# Patient Record
Sex: Female | Born: 1980 | Hispanic: Yes | Marital: Married | Smoking: Never smoker
Health system: Southern US, Community
[De-identification: ages and names within clinical notes are randomized; demographics above are authoritative.]

---

## 2005-05-07 ENCOUNTER — Inpatient Hospital Stay: Payer: Self-pay

## 2017-10-22 ENCOUNTER — Ambulatory Visit: Payer: Self-pay | Attending: Oncology | Admitting: *Deleted

## 2017-10-22 ENCOUNTER — Ambulatory Visit
Admission: RE | Admit: 2017-10-22 | Discharge: 2017-10-22 | Disposition: A | Discharge status: Home / Self Care | Payer: Self-pay | Source: Ambulatory Visit | Attending: Oncology | Admitting: Oncology

## 2017-10-22 ENCOUNTER — Encounter (INDEPENDENT_AMBULATORY_CARE_PROVIDER_SITE_OTHER): Payer: Self-pay

## 2017-10-22 VITALS — BP 110/74 | HR 68 | Temp 97.8°F | Ht 60.0 in | Wt 152.0 lb

## 2017-10-22 DIAGNOSIS — N63 Unspecified lump in unspecified breast: Secondary | ICD-10-CM

## 2017-10-22 NOTE — Progress Notes (Signed)
  Subjective:     Patient ID: Sierra Madden, female   DOB: 07/22/1980, 37 y.o.   MRN: [id number]  HPI   Review of Systems     Objective:   Physical Exam  Pulmonary/Chest:         Assessment:     37 year old Hispanic female [facility] evaluation of a left mass.  Elna Breslowarol Hernandez, the interpreter present during the interview and exam.  Patient states she found the nodule about 5 weeks ago on self exam.  Questioned her in regards to the reported nipple discharge by her provider.  States she has never had any nipple discharge.  States the provider squeezed her nipple in the office, and there was yellow discharge.  Denies any spontaneous discharge or any on expression prior to that visit.  On clinical breast exam I can palpate an approximate 2X1 cm mobile nodule at 3:00 left breast at the edge of the areola.  Taught self breast awareness.  Patient has been screened for eligibility.  She does not have any insurance, Medicare or Medicaid.  She also meets financial eligibility.  Hand-out given on the Affordable Care Act.    Plan:     Will get bilateral diagnostic mammogram and ultrasound.  Will follow-up per [facility] protocol.

## 2017-10-23 ENCOUNTER — Encounter: Payer: Self-pay | Admitting: *Deleted

## 2017-10-23 NOTE — Progress Notes (Signed)
Letter mailed from the [facility] to inform patient of her normal mammogram results.  Patient is to follow-up with annual screening at the age of 37.  [facility] to [place].

## 2017-10-23 NOTE — Patient Instructions (Signed)
Gave patient hand-out, Women Staying Healthy, Active and Well from [facility], with education on breast health, pap smears, heart and colon health. 

## 2017-10-30 ENCOUNTER — Encounter: Payer: Self-pay | Admitting: Emergency Medicine

## 2017-10-30 ENCOUNTER — Other Ambulatory Visit: Payer: Self-pay

## 2017-10-30 ENCOUNTER — Emergency Department: Payer: No Typology Code available for payment source

## 2017-10-30 ENCOUNTER — Emergency Department
Admission: EM | Admit: 2017-10-30 | Discharge: 2017-10-30 | Disposition: A | Discharge status: Home / Self Care | Payer: No Typology Code available for payment source | Attending: Emergency Medicine | Admitting: Emergency Medicine

## 2017-10-30 DIAGNOSIS — Y998 Other external cause status: Secondary | ICD-10-CM | POA: Insufficient documentation

## 2017-10-30 DIAGNOSIS — T07XXXA Unspecified multiple injuries, initial encounter: Secondary | ICD-10-CM

## 2017-10-30 DIAGNOSIS — Y92410 Unspecified street and highway as the place of occurrence of the external cause: Secondary | ICD-10-CM | POA: Insufficient documentation

## 2017-10-30 DIAGNOSIS — Y9389 Activity, other specified: Secondary | ICD-10-CM | POA: Diagnosis not present

## 2017-10-30 DIAGNOSIS — S161XXA Strain of muscle, fascia and tendon at neck level, initial encounter: Secondary | ICD-10-CM | POA: Diagnosis not present

## 2017-10-30 DIAGNOSIS — S1091XA Abrasion of unspecified part of neck, initial encounter: Secondary | ICD-10-CM | POA: Insufficient documentation

## 2017-10-30 DIAGNOSIS — S40812A Abrasion of left upper arm, initial encounter: Secondary | ICD-10-CM | POA: Insufficient documentation

## 2017-10-30 DIAGNOSIS — S199XXA Unspecified injury of neck, initial encounter: Secondary | ICD-10-CM | POA: Diagnosis present

## 2017-10-30 MED ORDER — NAPROXEN 500 MG PO TABS
500.0000 mg | ORAL_TABLET | Freq: Once | ORAL | Status: AC
  Administered 2017-10-30: 500 mg via ORAL
  Filled 2017-10-30: qty 1

## 2017-10-30 MED ORDER — NAPROXEN 500 MG PO TABS
500.0000 mg | ORAL_TABLET | Freq: Two times a day (BID) | ORAL | 0 refills | Status: AC
Start: 2017-10-30 — End: 2017-11-14

## 2017-10-30 MED ORDER — CYCLOBENZAPRINE HCL 5 MG PO TABS: 5.0000 mg | ORAL_TABLET | Freq: Three times a day (TID) | ORAL | 0 refills | Status: DC | PRN

## 2017-10-30 MED ORDER — CYCLOBENZAPRINE HCL 10 MG PO TABS
10.0000 mg | ORAL_TABLET | Freq: Once | ORAL | Status: AC
  Administered 2017-10-30: 10 mg via ORAL
  Filled 2017-10-30: qty 1
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
## 2017-10-30 NOTE — ED Notes (Signed)
Pt denies any neck pain at this time.  
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
## 2017-11-02 ENCOUNTER — Other Ambulatory Visit: Payer: Self-pay
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
## 2023-09-08 ENCOUNTER — Ambulatory Visit
Admission: EM | Admit: 2023-09-08 | Discharge: 2023-09-08 | Disposition: A | Discharge status: Home / Self Care | Payer: Self-pay | Attending: Emergency Medicine | Admitting: Emergency Medicine

## 2023-09-08 DIAGNOSIS — J302 Other seasonal allergic rhinitis: Secondary | ICD-10-CM

## 2023-09-08 LAB — POC SARS CORONAVIRUS 2 AG -  ED: SARS Coronavirus 2 Ag: NEGATIVE

## 2023-09-08 MED ORDER — LEVOCETIRIZINE DIHYDROCHLORIDE 5 MG PO TABS: 5.0000 mg | ORAL_TABLET | Freq: Every evening | ORAL | 0 refills | Status: DC

## 2023-09-08 MED ORDER — FLUTICASONE PROPIONATE 50 MCG/ACT NA SUSP: 1.0000 | Freq: Every day | NASAL | 0 refills | Status: DC

## 2023-09-08 NOTE — ED Triage Notes (Addendum)
 Patient to Urgent Care with complaints of productive cough/ headaches. Symptoms x3 days. Worse at night. Denies any known fevers.  Hx of seasonal allergies. Taking otc allergy medication.

## 2023-09-08 NOTE — ED Provider Notes (Signed)
 Arlander Bellman    CSN: [id number] Arrival date & time: 09/08/23  1417      History   Chief Complaint Chief Complaint  Patient presents with   Cough    HPI Dayjah Selman is a 43 y.o. female.  Accompanied by her husband, patient presents with postnasal drip, cough, headache x 3 days.  No fever or shortness of breath.  Treatment attempted with Zyrtec.  Patient reports history of seasonal allergies.  The history is provided by the patient, the spouse and medical records. A language interpreter was used.    History reviewed. No pertinent past medical history.  There are no active problems to display for this patient.   History reviewed. No pertinent surgical history.  OB History   No obstetric history on file.      Home Medications    Prior to Admission medications   Medication Sig Start Date End Date Taking? Authorizing Provider  fluticasone  (FLONASE ) 50 MCG/ACT nasal spray Place 1 spray into both nostrils daily. 09/08/23  Yes Wellington Half, NP  levocetirizine (XYZAL ) 5 MG tablet Take 1 tablet (5 mg total) by mouth every evening for 14 days. 09/08/23 09/22/23 Yes Wellington Half, NP  cyclobenzaprine  (FLEXERIL ) 5 MG tablet Take 1 tablet (5 mg total) by mouth 3 (three) times daily as needed for muscle spasms. Patient not taking: Reported on 09/08/2023 10/30/17   Menshew, Raye Cai, PA-C    Family History Family History  Problem Relation Age of Onset   Breast cancer Neg Hx     Social History Social History   Tobacco Use   Smoking status: Never   Smokeless tobacco: Never  Vaping Use   Vaping status: Never Used  Substance Use Topics   Alcohol use: Never   Drug use: Never     Allergies   Patient has no known allergies.   Review of Systems Review of Systems  Constitutional:  Negative for chills and fever.  HENT:  Positive for postnasal drip. Negative for ear pain and sore throat.   Respiratory:  Positive for cough. Negative for shortness of  breath.      Physical Exam Triage Vital Signs ED Triage Vitals  Encounter Vitals Group     BP 09/08/23 1500 136/81     Systolic BP Percentile --      Diastolic BP Percentile --      Pulse Rate 09/08/23 1500 73     Resp 09/08/23 1500 18     Temp 09/08/23 1500 98.9 F (37.2 C)     Temp src --      SpO2 09/08/23 1500 97 %     Weight --      Height --      Head Circumference --      Peak Flow --      Pain Score 09/08/23 1503 8     Pain Loc --      Pain Education --      Exclude from Growth Chart --    No data found.  Updated Vital Signs BP 136/81   Pulse 73   Temp 98.9 F (37.2 C)   Resp 18   LMP 09/06/2023   SpO2 97%   Visual Acuity Right Eye Distance:   Left Eye Distance:   Bilateral Distance:    Right Eye Near:   Left Eye Near:    Bilateral Near:     Physical Exam Constitutional:      General: She is not  in acute distress. HENT:     Right Ear: Tympanic membrane normal.     Left Ear: Tympanic membrane normal.     Nose: Nose normal.     Mouth/Throat:     Mouth: Mucous membranes are moist.     Pharynx: Oropharynx is clear.     Comments: PND Cardiovascular:     Rate and Rhythm: Normal rate and regular rhythm.     Heart sounds: Normal heart sounds.  Pulmonary:     Effort: Pulmonary effort is normal. No respiratory distress.     Breath sounds: Normal breath sounds.  Neurological:     Mental Status: She is alert.      UC Treatments / Results  Labs (all labs ordered are listed, but only abnormal results are displayed) Labs Reviewed  POC SARS CORONAVIRUS 2 AG -  ED    EKG   Radiology No results found.  Procedures Procedures (including critical care time)  Medications Ordered in UC Medications - No data to display  Initial Impression / Assessment and Plan / UC Course  I have reviewed the triage vital signs and the nursing notes.  Pertinent labs & imaging results that were available during my care of the patient were reviewed by me and  considered in my medical decision making (see chart for details).    Seasonal allergies.  COVID-negative.  Treating with Flonase  nasal spray and Xyzal .  Instructed patient to stop taking the Zyrtec.  Education provided on allergic rhinitis.  Instructed patient to follow up with her PCP if her symptoms are not improving.  She agrees to plan of care.   Final Clinical Impressions(s) / UC Diagnoses   Final diagnoses:  Seasonal allergies     Discharge Instructions      The COVID test is negative.  Use the Flonase  nasal spray and take the Xyzal  as directed.  Stop the Zyrtec.  Follow-up with your primary care provider if your symptoms are not improving.      ED Prescriptions     Medication Sig Dispense Auth. Provider   fluticasone  (FLONASE ) 50 MCG/ACT nasal spray Place 1 spray into both nostrils daily. 16 g Wellington Half, NP   levocetirizine (XYZAL ) 5 MG tablet Take 1 tablet (5 mg total) by mouth every evening for 14 days. 14 tablet Wellington Half, NP      PDMP not reviewed this encounter.   Wellington Half, NP 09/08/23 1535

## 2023-09-08 NOTE — Discharge Instructions (Addendum)
 The COVID test is negative.  Use the Flonase  nasal spray and take the Xyzal  as directed.  Stop the Zyrtec.  Follow-up with your primary care provider if your symptoms are not improving.

## 2024-04-23 ENCOUNTER — Telehealth: Payer: Self-pay

## 2024-04-24 ENCOUNTER — Encounter: Payer: Self-pay | Admitting: Family Medicine

## 2024-05-09 ENCOUNTER — Other Ambulatory Visit: Payer: Self-pay | Admitting: Family Medicine

## 2024-05-09 DIAGNOSIS — Z1231 Encounter for screening mammogram for malignant neoplasm of breast: Secondary | ICD-10-CM

## 2024-05-30 ENCOUNTER — Ambulatory Visit
Admission: RE | Admit: 2024-05-30 | Discharge: 2024-05-30 | Disposition: A | Payer: Self-pay | Source: Ambulatory Visit | Attending: Family Medicine | Admitting: Family Medicine

## 2024-05-30 DIAGNOSIS — Z1231 Encounter for screening mammogram for malignant neoplasm of breast: Secondary | ICD-10-CM | POA: Insufficient documentation

## 2024-06-03 ENCOUNTER — Other Ambulatory Visit: Payer: Self-pay | Admitting: Obstetrics and Gynecology

## 2024-06-03 DIAGNOSIS — R928 Other abnormal and inconclusive findings on diagnostic imaging of breast: Secondary | ICD-10-CM

## 2024-06-24 ENCOUNTER — Ambulatory Visit
Admission: RE | Admit: 2024-06-24 | Discharge: 2024-06-24 | Disposition: A | Payer: Self-pay | Source: Ambulatory Visit | Attending: Obstetrics and Gynecology | Admitting: Obstetrics and Gynecology

## 2024-06-24 ENCOUNTER — Other Ambulatory Visit: Payer: Self-pay

## 2024-06-24 ENCOUNTER — Ambulatory Visit: Payer: Self-pay

## 2024-06-24 ENCOUNTER — Other Ambulatory Visit: Payer: Self-pay | Admitting: Obstetrics and Gynecology

## 2024-06-24 ENCOUNTER — Ambulatory Visit: Payer: Self-pay | Admitting: Nurse Practitioner

## 2024-06-24 VITALS — BP 125/85 | Ht 60.0 in | Wt 154.0 lb

## 2024-06-24 DIAGNOSIS — R928 Other abnormal and inconclusive findings on diagnostic imaging of breast: Secondary | ICD-10-CM

## 2024-06-24 DIAGNOSIS — R87615 Unsatisfactory cytologic smear of cervix: Secondary | ICD-10-CM
